# Patient Record
Sex: Female | Born: 1960 | Race: White | Hispanic: No | Marital: Married | Smoking: Former smoker
Health system: Southern US, Community
[De-identification: ages and names within clinical notes are randomized; demographics above are authoritative.]

## PROBLEM LIST (undated history)

## (undated) DIAGNOSIS — S83249A Other tear of medial meniscus, current injury, unspecified knee, initial encounter: Secondary | ICD-10-CM

## (undated) DIAGNOSIS — N3090 Cystitis, unspecified without hematuria: Secondary | ICD-10-CM

## (undated) DIAGNOSIS — K8020 Calculus of gallbladder without cholecystitis without obstruction: Secondary | ICD-10-CM

## (undated) DIAGNOSIS — E119 Type 2 diabetes mellitus without complications: Secondary | ICD-10-CM

## (undated) DIAGNOSIS — F419 Anxiety disorder, unspecified: Secondary | ICD-10-CM

## (undated) DIAGNOSIS — J309 Allergic rhinitis, unspecified: Secondary | ICD-10-CM

## (undated) DIAGNOSIS — S22000A Wedge compression fracture of unspecified thoracic vertebra, initial encounter for closed fracture: Secondary | ICD-10-CM

## (undated) DIAGNOSIS — G4700 Insomnia, unspecified: Secondary | ICD-10-CM

## (undated) DIAGNOSIS — R32 Unspecified urinary incontinence: Secondary | ICD-10-CM

## (undated) DIAGNOSIS — G5600 Carpal tunnel syndrome, unspecified upper limb: Secondary | ICD-10-CM

## (undated) DIAGNOSIS — G4730 Sleep apnea, unspecified: Secondary | ICD-10-CM

## (undated) DIAGNOSIS — Z720 Tobacco use: Secondary | ICD-10-CM

## (undated) DIAGNOSIS — M5134 Other intervertebral disc degeneration, thoracic region: Secondary | ICD-10-CM

## (undated) DIAGNOSIS — M7120 Synovial cyst of popliteal space [Baker], unspecified knee: Secondary | ICD-10-CM

## (undated) DIAGNOSIS — M1990 Unspecified osteoarthritis, unspecified site: Secondary | ICD-10-CM

## (undated) DIAGNOSIS — E039 Hypothyroidism, unspecified: Secondary | ICD-10-CM

## (undated) DIAGNOSIS — J449 Chronic obstructive pulmonary disease, unspecified: Secondary | ICD-10-CM

## (undated) DIAGNOSIS — M3130 Wegener's granulomatosis without renal involvement: Secondary | ICD-10-CM

## (undated) DIAGNOSIS — F329 Major depressive disorder, single episode, unspecified: Secondary | ICD-10-CM

## (undated) DIAGNOSIS — E8940 Asymptomatic postprocedural ovarian failure: Secondary | ICD-10-CM

## (undated) DIAGNOSIS — F32A Depression, unspecified: Secondary | ICD-10-CM

## (undated) DIAGNOSIS — E785 Hyperlipidemia, unspecified: Secondary | ICD-10-CM

## (undated) DIAGNOSIS — K5900 Constipation, unspecified: Secondary | ICD-10-CM

## (undated) HISTORY — DX: Chronic obstructive pulmonary disease, unspecified: J44.9

## (undated) HISTORY — PX: LUNG BIOPSY: SHX232

## (undated) HISTORY — DX: Tobacco use: Z72.0

## (undated) HISTORY — DX: Allergic rhinitis, unspecified: J30.9

## (undated) HISTORY — DX: Calculus of gallbladder without cholecystitis without obstruction: K80.20

## (undated) HISTORY — DX: Insomnia, unspecified: G47.00

## (undated) HISTORY — DX: Wegener's granulomatosis without renal involvement: M31.30

## (undated) HISTORY — PX: COLONOSCOPY: SHX174

## (undated) HISTORY — DX: Cystitis, unspecified without hematuria: N30.90

## (undated) HISTORY — DX: Carpal tunnel syndrome, unspecified upper limb: G56.00

## (undated) HISTORY — PX: ABDOMINAL HYSTERECTOMY: SHX81

## (undated) HISTORY — PX: KNEE SURGERY: SHX244

## (undated) HISTORY — PX: ESOPHAGOGASTRODUODENOSCOPY: SHX1529

## (undated) HISTORY — DX: Hypothyroidism, unspecified: E03.9

## (undated) HISTORY — PX: TONSILLECTOMY: SUR1361

## (undated) HISTORY — DX: Unspecified osteoarthritis, unspecified site: M19.90

## (undated) HISTORY — DX: Wedge compression fracture of unspecified thoracic vertebra, initial encounter for closed fracture: S22.000A

## (undated) HISTORY — PX: WRIST ARTHROSCOPY: SUR100

## (undated) HISTORY — DX: Type 2 diabetes mellitus without complications: E11.9

## (undated) HISTORY — DX: Other tear of medial meniscus, current injury, unspecified knee, initial encounter: S83.249A

## (undated) HISTORY — DX: Sleep apnea, unspecified: G47.30

## (undated) HISTORY — DX: Asymptomatic postprocedural ovarian failure: E89.40

## (undated) HISTORY — DX: Hyperlipidemia, unspecified: E78.5

## (undated) HISTORY — DX: Other intervertebral disc degeneration, thoracic region: M51.34

## (undated) HISTORY — DX: Synovial cyst of popliteal space (Baker), unspecified knee: M71.20

## (undated) HISTORY — DX: Unspecified urinary incontinence: R32

## (undated) HISTORY — DX: Constipation, unspecified: K59.00

## (undated) HISTORY — DX: Depression, unspecified: F32.A

## (undated) HISTORY — PX: CHOLECYSTECTOMY: SHX55

## (undated) HISTORY — DX: Anxiety disorder, unspecified: F41.9

## (undated) HISTORY — PX: BLADDER REPAIR: SHX76

---
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
## 2004-06-21 DIAGNOSIS — M7120 Synovial cyst of popliteal space [Baker], unspecified knee: Secondary | ICD-10-CM | POA: Insufficient documentation

## 2004-06-21 DIAGNOSIS — IMO0002 Reserved for concepts with insufficient information to code with codable children: Secondary | ICD-10-CM | POA: Insufficient documentation

## 2004-06-25 DIAGNOSIS — G5601 Carpal tunnel syndrome, right upper limb: Secondary | ICD-10-CM | POA: Insufficient documentation

## 2005-08-22 DIAGNOSIS — J439 Emphysema, unspecified: Secondary | ICD-10-CM | POA: Insufficient documentation

## 2006-08-03 DIAGNOSIS — S22009A Unspecified fracture of unspecified thoracic vertebra, initial encounter for closed fracture: Secondary | ICD-10-CM | POA: Insufficient documentation

## 2011-11-08 DIAGNOSIS — M3130 Wegener's granulomatosis without renal involvement: Secondary | ICD-10-CM | POA: Insufficient documentation

## 2011-11-08 DIAGNOSIS — M25552 Pain in left hip: Secondary | ICD-10-CM | POA: Insufficient documentation

## 2011-11-18 DIAGNOSIS — R0681 Apnea, not elsewhere classified: Secondary | ICD-10-CM | POA: Insufficient documentation

## 2012-06-05 DIAGNOSIS — E785 Hyperlipidemia, unspecified: Secondary | ICD-10-CM | POA: Insufficient documentation

## 2012-06-05 DIAGNOSIS — M810 Age-related osteoporosis without current pathological fracture: Secondary | ICD-10-CM | POA: Insufficient documentation

## 2012-06-05 DIAGNOSIS — Z90710 Acquired absence of both cervix and uterus: Secondary | ICD-10-CM | POA: Insufficient documentation

## 2012-06-05 DIAGNOSIS — J8410 Pulmonary fibrosis, unspecified: Secondary | ICD-10-CM | POA: Insufficient documentation

## 2012-07-21 DIAGNOSIS — R609 Edema, unspecified: Secondary | ICD-10-CM | POA: Insufficient documentation

## 2012-09-18 DIAGNOSIS — Z87891 Personal history of nicotine dependence: Secondary | ICD-10-CM | POA: Insufficient documentation

## 2012-09-27 DIAGNOSIS — Z86010 Personal history of colonic polyps: Secondary | ICD-10-CM | POA: Insufficient documentation

## 2013-03-13 DIAGNOSIS — E039 Hypothyroidism, unspecified: Secondary | ICD-10-CM | POA: Insufficient documentation

## 2013-06-20 DIAGNOSIS — N390 Urinary tract infection, site not specified: Secondary | ICD-10-CM | POA: Insufficient documentation

## 2013-08-07 DIAGNOSIS — Z79899 Other long term (current) drug therapy: Secondary | ICD-10-CM | POA: Insufficient documentation

## 2013-08-20 DIAGNOSIS — R140 Abdominal distension (gaseous): Secondary | ICD-10-CM | POA: Insufficient documentation

## 2014-03-18 DIAGNOSIS — R140 Abdominal distension (gaseous): Secondary | ICD-10-CM | POA: Insufficient documentation

## 2014-11-12 DIAGNOSIS — M160 Bilateral primary osteoarthritis of hip: Secondary | ICD-10-CM | POA: Insufficient documentation

## 2017-04-04 DIAGNOSIS — S12501D Unspecified nondisplaced fracture of sixth cervical vertebra, subsequent encounter for fracture with routine healing: Secondary | ICD-10-CM | POA: Insufficient documentation

## 2017-07-11 DIAGNOSIS — R519 Headache, unspecified: Secondary | ICD-10-CM | POA: Insufficient documentation

## 2019-07-04 DIAGNOSIS — Z1211 Encounter for screening for malignant neoplasm of colon: Secondary | ICD-10-CM | POA: Insufficient documentation

## 2019-07-26 ENCOUNTER — Encounter: Payer: Self-pay | Admitting: Gastroenterology

## 2019-08-23 DIAGNOSIS — F329 Major depressive disorder, single episode, unspecified: Secondary | ICD-10-CM | POA: Insufficient documentation

## 2019-08-23 DIAGNOSIS — F32A Depression, unspecified: Secondary | ICD-10-CM | POA: Insufficient documentation

## 2019-08-23 DIAGNOSIS — F419 Anxiety disorder, unspecified: Secondary | ICD-10-CM | POA: Insufficient documentation

## 2019-08-29 ENCOUNTER — Other Ambulatory Visit: Payer: Self-pay

## 2019-08-29 ENCOUNTER — Encounter: Payer: Self-pay | Admitting: Gastroenterology

## 2019-08-29 ENCOUNTER — Telehealth: Payer: Self-pay | Admitting: Gastroenterology

## 2019-08-29 ENCOUNTER — Ambulatory Visit (INDEPENDENT_AMBULATORY_CARE_PROVIDER_SITE_OTHER): Payer: 59 | Admitting: Gastroenterology

## 2019-08-29 ENCOUNTER — Encounter (INDEPENDENT_AMBULATORY_CARE_PROVIDER_SITE_OTHER): Payer: Self-pay

## 2019-08-29 ENCOUNTER — Ambulatory Visit (HOSPITAL_COMMUNITY)
Admission: RE | Admit: 2019-08-29 | Discharge: 2019-08-29 | Disposition: A | Discharge status: Home / Self Care | Payer: 59 | Source: Ambulatory Visit | Attending: Gastroenterology | Admitting: Gastroenterology

## 2019-08-29 VITALS — BP 128/86 | HR 72 | Temp 98.0°F | Ht 60.75 in | Wt 144.0 lb

## 2019-08-29 DIAGNOSIS — R140 Abdominal distension (gaseous): Secondary | ICD-10-CM | POA: Diagnosis not present

## 2019-08-29 DIAGNOSIS — Z9981 Dependence on supplemental oxygen: Secondary | ICD-10-CM | POA: Diagnosis not present

## 2019-08-29 DIAGNOSIS — R1084 Generalized abdominal pain: Secondary | ICD-10-CM | POA: Insufficient documentation

## 2019-08-29 LAB — POCT I-STAT CREATININE: Creatinine, Ser: 0.6 mg/dL (ref 0.44–1.00)

## 2019-08-29 MED ORDER — IOHEXOL 300 MG/ML  SOLN
100.0000 mL | Freq: Once | INTRAMUSCULAR | Status: AC | PRN
  Administered 2019-08-29: 100 mL via INTRAVENOUS

## 2019-08-29 MED ORDER — SODIUM CHLORIDE (PF) 0.9 % IJ SOLN
INTRAMUSCULAR | Status: AC
  Filled 2019-08-29: qty 50

## 2019-08-29 NOTE — Progress Notes (Addendum)
Referring Provider: Allie Dimmer, MD Primary Care Physician:  No primary care provider on file.  Reason for Consultation: Abdominal bloating   IMPRESSION:  Abdominal bloating Chronic constipation TSH 10.02  Differential includes: IBS-Constipated, slow transit, dyssynergic defecation.   PLAN: EGD CT of the abd/pelvis with contrast CMP, CBC with differential, B12, folate Trial of IBGuard  Please see the "Patient Instructions" section for addition details about the plan.  HPI: Tracy Avila is a 58 y.o. female referred by Dr. Jenean Lindau for further evaluation of abdominal bloating.  The history is obtained through the patient and review of her electronic health record.  She has a history of anxiety, COPD, diabetes, hyperlipidemia, hypothyroidism, Wegener's granulomatosis.  She has had a prior cholecystectomy for cholelithiasis.  She has chronic constipation.  Now controlled on Amitiza a 24 mcg twice daily, lactulose 30 cc twice daily, stool softeners and still has difficulty with defecation. Now having 3 bowel movements daily. Without medications she would go one week between between bowel movements. No straining. Some mucous. No blood. Sense of complete evacuation.  She had to manually disimpact herself in the past.  Occasionally uses Epsom salts or magnesium citrate.  Dr. Vernon Prey, a gastroenterologist with the [facility] in clinic added a daily dose of MiraLAX and recommended that she use magnesium citrate 1 bottle once or twice a week.  Constant bloating and abdominal distension x 9-12 months. When she eats she feels very tight within 20 minutes of eating. Early satiety. Good appetite. Has gained 20 pounds.  Occasional feels like there is a marble in the RUQ. Malodorous diarrhea. No eructation. No change with defecation.   She reports an abdominal  in [place] last month that did not reveal the source of bloating. It showed hepatic steatosis. No sonographic abnormalities were she describes the  knot in the abdominal wall. No additional abdominal imaging.   Colonoscopy with  two tubular adenomas at [facility]. Colonoscopy one month ago. No prior upper endoscopy.    Normal colonoscopy with Dr. Vernon Prey 07/04/2019. He recommended another colonoscopy in 5 years.   History of total abdominal hysterectomy. She was evaluated by GYN who told her they could not find the source.   Labs 05/20/2019: WBC 7.9, hemoglobin 12.7, platelets 241, MCV 102, BUN 29, creatinine 1.02, AST 22, ALT 32, alk phos 77, total bilirubin 0.3, TSH 10.01.  Her levothyroxine was increased to 88 mcg daily at that time.   No known family history of colon cancer or polyps. No family history of uterine/endometrial cancer, pancreatic cancer or gastric/stomach cancer.     Past Medical History:  Diagnosis Date  . Acute medial meniscus tear    left  . Allergic rhinitis   . Anxiety   . Baker's cyst    left knee  . Carpal tunnel syndrome   . Compression fracture of thoracic vertebra (HCC)    #7 and C 6  . Constipation   . COPD (chronic obstructive pulmonary disease) ([place])   . Cystitis   . DDD (degenerative disc disease), thoracic   . Depression   . Diabetes mellitus ([place])   . Hyperlipidemia   . Hypothyroidism   . Incontinence   . Insomnia   . Surgical menopause   . Tobacco abuse   . Wegener's granulomatosis ([place])    on remission 01/01/2019     Current Outpatient Medications  Medication Sig Dispense Refill  . ALPRAZolam (XANAX) 1 MG tablet Take 1 mg by mouth 3 (three) times daily as needed for anxiety.    Marland Kitchen  benzonatate (TESSALON) 100 MG capsule Take 100 mg by mouth 3 (three) times daily as needed.    . chlorhexidine (PERIDEX) 0.12 % solution Take 15 mLs by mouth 2 (two) times daily after a meal.    . citalopram (CELEXA) 20 MG tablet Take 20 mg by mouth daily.    Marland Kitchen esomeprazole (NEXIUM) 40 MG capsule TAKE 1 CAPSULE BY MOUTH 2 TIMES A DAY    . gemfibrozil (LOPID) 600 MG tablet Take 1 tablet by mouth 2 (two) times  daily before a meal.    . lactulose (CHRONULAC) 10 GM/15ML solution Take 30 mLs by mouth 2 (two) times daily.    Marland Kitchen levothyroxine (SYNTHROID) 88 MCG tablet Take 88 mcg by mouth daily.    Marland Kitchen lubiprostone (AMITIZA) 24 MCG capsule Take 24 mcg by mouth 2 (two) times daily.    . methadone (DOLOPHINE) 10 MG tablet Take by mouth.    . naloxone (NARCAN) 4 MG/0.1ML LIQD nasal spray kit Place 4 mg into the nose.    . oxyCODONE (OXY IR/ROXICODONE) 5 MG immediate release tablet Take by mouth.    . OXYGEN 2 L.    . trazodone (DESYREL) 300 MG tablet Take 300 mg by mouth at bedtime.     No current facility-administered medications for this visit.     Allergies as of 08/29/2019 - never reviewed  Allergen Reaction Noted  . Albuterol Other (See Comments) 11/18/2009  . Codeine Hives and Other (See Comments) 11/18/2009  . Levothyroxine Other (See Comments) and Palpitations 05/16/2013  . Morphine Hives and Rash 06/05/2012  . Ciprofloxacin Hives, Other (See Comments), and Rash 11/18/2009  . Zolmitriptan Nausea And Vomiting 11/18/2009  . Tape Rash 01/02/2019    Family History  Problem Relation Age of Onset  . Cancer Mother   . Emphysema Father   . Heart attack Father   . Diabetes Sister   . Heart attack Brother     Social History   Socioeconomic History  . Marital status: Not on file    Spouse name: Not on file  . Number of children: Not on file  . Years of education: Not on file  . Highest education level: Not on file  Occupational History  . Not on file  Social Needs  . Financial resource strain: Not on file  . Food insecurity    Worry: Not on file    Inability: Not on file  . Transportation needs    Medical: Not on file    Non-medical: Not on file  Tobacco Use  . Smoking status: Former Smoker    Types: Cigarettes    Quit date: 07/27/2012    Years since quitting: 7.0  Substance and Sexual Activity  . Alcohol use: Not on file  . Drug use: Not on file  . Sexual activity: Not on file   Lifestyle  . Physical activity    Days per week: Not on file    Minutes per session: Not on file  . Stress: Not on file  Relationships  . Social Herbalist on phone: Not on file    Gets together: Not on file    Attends religious service: Not on file    Active member of club or organization: Not on file    Attends meetings of clubs or organizations: Not on file    Relationship status: Not on file  . Intimate partner violence    Fear of current or ex partner: Not on file  Emotionally abused: Not on file    Physically abused: Not on file    Forced sexual activity: Not on file  Other Topics Concern  . Not on file  Social History Narrative  . Not on file    Review of Systems: 12 system ROS is negative except as noted above except for anxiety, back pain, depression, headaches, itching, muscle pains, insomnia.   Physical Exam: General:   Alert, pleasant and cooperative in NAD Head:  Normocephalic and atraumatic. Eyes:  Sclera clear, no icterus.   Conjunctiva pink. Ears:  Normal auditory acuity. Nose:  No deformity, discharge,  or lesions. Mouth:  No deformity or lesions.   Neck:  Supple; no masses or thyromegaly. Lungs:  Clear throughout to auscultation.   No wheezes. Heart:  Regular rate and rhythm; no murmurs. Abdomen: Protuberent, firm, nontender,normal bowel sounds, no rebound or guarding. No hepatosplenomegaly.  No fluid wave.  Rectal:  Deferred  Msk:  Symmetrical. No boney deformities LAD: No inguinal or umbilical LAD Extremities:  No clubbing or edema. Neurologic:  Alert and  oriented x4;  grossly nonfocal Skin:  Intact without significant lesions or rashes. Psych:  Alert and cooperative. Normal mood and affect.   Emric Kowalewski L. Tarri Glenn, MD, MPH 08/29/2019, 12:36 PM

## 2019-08-29 NOTE — H&P (View-Only) (Signed)
Referring Provider: Allie Dimmer, MD Primary Care Physician:  No primary care provider on file.  Reason for Consultation: Abdominal bloating   IMPRESSION:  Abdominal bloating Chronic constipation TSH 10.02  Differential includes: IBS-Constipated, slow transit, dyssynergic defecation.   PLAN: EGD CT of the abd/pelvis with contrast CMP, CBC with differential, B12, folate Trial of IBGuard  Please see the "Patient Instructions" section for addition details about the plan.  HPI: Tracy Avila is a 58 y.o. female referred by Dr. Jenean Lindau for further evaluation of abdominal bloating.  The history is obtained through the patient and review of her electronic health record.  She has a history of anxiety, COPD, diabetes, hyperlipidemia, hypothyroidism, Wegener's granulomatosis.  She has had a prior cholecystectomy for cholelithiasis.  She has chronic constipation.  Now controlled on Amitiza a 24 mcg twice daily, lactulose 30 cc twice daily, stool softeners and still has difficulty with defecation. Now having 3 bowel movements daily. Without medications she would go one week between between bowel movements. No straining. Some mucous. No blood. Sense of complete evacuation.  She had to manually disimpact herself in the past.  Occasionally uses Epsom salts or magnesium citrate.  Dr. Vernon Prey, a gastroenterologist with the [facility] in clinic added a daily dose of MiraLAX and recommended that she use magnesium citrate 1 bottle once or twice a week.  Constant bloating and abdominal distension x 9-12 months. When she eats she feels very tight within 20 minutes of eating. Early satiety. Good appetite. Has gained 20 pounds.  Occasional feels like there is a marble in the RUQ. Malodorous diarrhea. No eructation. No change with defecation.   She reports an abdominal  in [place] last month that did not reveal the source of bloating. It showed hepatic steatosis. No sonographic abnormalities were she describes the  knot in the abdominal wall. No additional abdominal imaging.   Colonoscopy with  two tubular adenomas at [facility]. Colonoscopy one month ago. No prior upper endoscopy.    Normal colonoscopy with Dr. Vernon Prey 07/04/2019. He recommended another colonoscopy in 5 years.   History of total abdominal hysterectomy. She was evaluated by GYN who told her they could not find the source.   Labs 05/20/2019: WBC 7.9, hemoglobin 12.7, platelets 241, MCV 102, BUN 29, creatinine 1.02, AST 22, ALT 32, alk phos 77, total bilirubin 0.3, TSH 10.01.  Her levothyroxine was increased to 88 mcg daily at that time.   No known family history of colon cancer or polyps. No family history of uterine/endometrial cancer, pancreatic cancer or gastric/stomach cancer.     Past Medical History:  Diagnosis Date   Acute medial meniscus tear    left   Allergic rhinitis    Anxiety    Baker's cyst    left knee   Carpal tunnel syndrome    Compression fracture of thoracic vertebra (HCC)    #7 and C 6   Constipation    COPD (chronic obstructive pulmonary disease) (HCC)    Cystitis    DDD (degenerative disc disease), thoracic    Depression    Diabetes mellitus ([place])    Hyperlipidemia    Hypothyroidism    Incontinence    Insomnia    Surgical menopause    Tobacco abuse    Wegener's granulomatosis ([place])    on remission 01/01/2019     Current Outpatient Medications  Medication Sig Dispense Refill   ALPRAZolam (XANAX) 1 MG tablet Take 1 mg by mouth 3 (three) times daily as needed for anxiety.  benzonatate (TESSALON) 100 MG capsule Take 100 mg by mouth 3 (three) times daily as needed.     chlorhexidine (PERIDEX) 0.12 % solution Take 15 mLs by mouth 2 (two) times daily after a meal.     citalopram (CELEXA) 20 MG tablet Take 20 mg by mouth daily.     esomeprazole (NEXIUM) 40 MG capsule TAKE 1 CAPSULE BY MOUTH 2 TIMES A DAY     gemfibrozil (LOPID) 600 MG tablet Take 1 tablet by mouth 2 (two) times  daily before a meal.     lactulose (CHRONULAC) 10 GM/15ML solution Take 30 mLs by mouth 2 (two) times daily.     levothyroxine (SYNTHROID) 88 MCG tablet Take 88 mcg by mouth daily.     lubiprostone (AMITIZA) 24 MCG capsule Take 24 mcg by mouth 2 (two) times daily.     methadone (DOLOPHINE) 10 MG tablet Take by mouth.     naloxone (NARCAN) 4 MG/0.1ML LIQD nasal spray kit Place 4 mg into the nose.     oxyCODONE (OXY IR/ROXICODONE) 5 MG immediate release tablet Take by mouth.     OXYGEN 2 L.     trazodone (DESYREL) 300 MG tablet Take 300 mg by mouth at bedtime.     No current facility-administered medications for this visit.     Allergies as of 08/29/2019 - never reviewed  Allergen Reaction Noted   Albuterol Other (See Comments) 11/18/2009   Codeine Hives and Other (See Comments) 11/18/2009   Levothyroxine Other (See Comments) and Palpitations 05/16/2013   Morphine Hives and Rash 06/05/2012   Ciprofloxacin Hives, Other (See Comments), and Rash 11/18/2009   Zolmitriptan Nausea And Vomiting 11/18/2009   Tape Rash 01/02/2019    Family History  Problem Relation Age of Onset   Cancer Mother    Emphysema Father    Heart attack Father    Diabetes Sister    Heart attack Brother     Social History   Socioeconomic History   Marital status: Not on file    Spouse name: Not on file   Number of children: Not on file   Years of education: Not on file   Highest education level: Not on file  Occupational History   Not on file  Social Needs   Financial resource strain: Not on file   Food insecurity    Worry: Not on file    Inability: Not on file   Transportation needs    Medical: Not on file    Non-medical: Not on file  Tobacco Use   Smoking status: Former Smoker    Types: Cigarettes    Quit date: 07/27/2012    Years since quitting: 7.0  Substance and Sexual Activity   Alcohol use: Not on file   Drug use: Not on file   Sexual activity: Not on file    Lifestyle   Physical activity    Days per week: Not on file    Minutes per session: Not on file   Stress: Not on file  Relationships   Social connections    Talks on phone: Not on file    Gets together: Not on file    Attends religious service: Not on file    Active member of club or organization: Not on file    Attends meetings of clubs or organizations: Not on file    Relationship status: Not on file   Intimate partner violence    Fear of current or ex partner: Not on file  Emotionally abused: Not on file    Physically abused: Not on file    Forced sexual activity: Not on file  Other Topics Concern   Not on file  Social History Narrative   Not on file    Review of Systems: 12 system ROS is negative except as noted above except for anxiety, back pain, depression, headaches, itching, muscle pains, insomnia.   Physical Exam: General:   Alert, pleasant and cooperative in NAD Head:  Normocephalic and atraumatic. Eyes:  Sclera clear, no icterus.   Conjunctiva pink. Ears:  Normal auditory acuity. Nose:  No deformity, discharge,  or lesions. Mouth:  No deformity or lesions.   Neck:  Supple; no masses or thyromegaly. Lungs:  Clear throughout to auscultation.   No wheezes. Heart:  Regular rate and rhythm; no murmurs. Abdomen: Protuberent, firm, nontender,normal bowel sounds, no rebound or guarding. No hepatosplenomegaly.  No fluid wave.  Rectal:  Deferred  Msk:  Symmetrical. No boney deformities LAD: No inguinal or umbilical LAD Extremities:  No clubbing or edema. Neurologic:  Alert and  oriented x4;  grossly nonfocal Skin:  Intact without significant lesions or rashes. Psych:  Alert and cooperative. Normal mood and affect.   Jamara Vary L. Tarri Glenn, MD, MPH 08/29/2019, 12:36 PM

## 2019-08-30 NOTE — Telephone Encounter (Signed)
See result note.  

## 2019-08-30 NOTE — Telephone Encounter (Signed)
Pt said she is returning your call °

## 2019-09-04 ENCOUNTER — Encounter: Payer: 59 | Admitting: Gastroenterology

## 2019-09-05 ENCOUNTER — Telehealth: Payer: Self-pay | Admitting: Gastroenterology

## 2019-09-05 DIAGNOSIS — R140 Abdominal distension (gaseous): Secondary | ICD-10-CM

## 2019-09-05 DIAGNOSIS — R1084 Generalized abdominal pain: Secondary | ICD-10-CM

## 2019-09-05 DIAGNOSIS — Z9981 Dependence on supplemental oxygen: Secondary | ICD-10-CM

## 2019-09-05 NOTE — Telephone Encounter (Signed)
You are [facility] 10/5 and 10/28 and your hospital week is starting 11/2.

## 2019-09-05 NOTE — Telephone Encounter (Signed)
Do we have any options on a purple block for one EGD?

## 2019-09-05 NOTE — Telephone Encounter (Signed)
I would be happy to try to do it on any of those days. Thanks.

## 2019-09-05 NOTE — Telephone Encounter (Signed)
Dr. Tarri Glenn your next available hospital schedule is on 10-28-2019.ok to wait until then?

## 2019-09-06 NOTE — Telephone Encounter (Signed)
Spoke to patient and signed her up for mychart and she will be able to access these instructions. Scheduled for 09-16-2019.

## 2019-09-06 NOTE — Telephone Encounter (Signed)
Left message on patients voicemail to call office.   

## 2019-09-06 NOTE — Telephone Encounter (Signed)
#  [phone]   Pt said she is returning your call

## 2019-09-09 NOTE — Addendum Note (Signed)
Addended by: Wyline Beady on: 09/09/2019 10:34 AM   Modules accepted: Orders

## 2019-09-09 NOTE — Addendum Note (Signed)
Addended by: Wyline Beady on: 09/09/2019 10:08 AM   Modules accepted: Orders, SmartSet

## 2019-09-09 NOTE — Telephone Encounter (Signed)
Instructions and [phone] screening instructions sent to patient via mychart.

## 2019-09-10 NOTE — Progress Notes (Signed)
Unable to reach Tracy Avila for preop endoscopy call. Left message for her to return call.

## 2019-09-12 ENCOUNTER — Other Ambulatory Visit (HOSPITAL_COMMUNITY)
Admission: RE | Admit: 2019-09-12 | Discharge: 2019-09-12 | Disposition: A | Discharge status: Home / Self Care | Payer: 59 | Source: Home / Self Care | Attending: Gastroenterology | Admitting: Gastroenterology

## 2019-09-12 ENCOUNTER — Other Ambulatory Visit (HOSPITAL_COMMUNITY)
Admission: RE | Admit: 2019-09-12 | Discharge: 2019-09-12 | Disposition: A | Discharge status: Home / Self Care | Payer: 59 | Attending: Gastroenterology | Admitting: Gastroenterology

## 2019-09-12 DIAGNOSIS — Z20828 Contact with and (suspected) exposure to other viral communicable diseases: Secondary | ICD-10-CM | POA: Diagnosis not present

## 2019-09-12 DIAGNOSIS — Z01812 Encounter for preprocedural laboratory examination: Secondary | ICD-10-CM | POA: Diagnosis not present

## 2019-09-13 LAB — NOVEL CORONAVIRUS, NAA (HOSP ORDER, SEND-OUT TO REF LAB; TAT 18-24 HRS): SARS-CoV-2, NAA: NOT DETECTED

## 2019-09-15 NOTE — Anesthesia Preprocedure Evaluation (Addendum)
Anesthesia Evaluation  Patient identified by MRN, date of birth, ID band Patient awake    Reviewed: Allergy & Precautions, NPO status , Patient's Chart, lab work & pertinent test results  Airway Mallampati: II  TM Distance: >3 FB Neck ROM: Full    Dental no notable dental hx. (+) Upper Dentures, Lower Dentures   Pulmonary sleep apnea , former smoker,    Pulmonary exam normal breath sounds clear to auscultation       Cardiovascular Exercise Tolerance: Good negative cardio ROS Normal cardiovascular exam Rhythm:Regular Rate:Normal     Neuro/Psych Depression  Neuromuscular disease negative psych ROS   GI/Hepatic negative GI ROS, Neg liver ROS,   Endo/Other  diabetes, Type 1Hypothyroidism   Renal/GU negative Renal ROS     Musculoskeletal  (+) Arthritis ,   Abdominal   Peds  Hematology negative hematology ROS (+)   Anesthesia Other Findings   Reproductive/Obstetrics negative OB ROS                            Anesthesia Physical Anesthesia Plan  ASA: III  Anesthesia Plan: MAC   Post-op Pain Management:    Induction: Intravenous  PONV Risk Score and Plan: Treatment may vary due to age or medical condition, Ondansetron and Dexamethasone  Airway Management Planned: Natural Airway and Nasal Cannula  Additional Equipment: None  Intra-op Plan:   Post-operative Plan:   Informed Consent: I have reviewed the patients History and Physical, chart, labs and discussed the procedure including the risks, benefits and alternatives for the proposed anesthesia with the patient or authorized representative who has indicated his/her understanding and acceptance.     Dental advisory given  Plan Discussed with: CRNA  Anesthesia Plan Comments: (EGD for constipation bloating and abdominal pain)       Anesthesia Quick Evaluation

## 2019-09-16 ENCOUNTER — Ambulatory Visit (HOSPITAL_COMMUNITY): Payer: 59 | Admitting: Anesthesiology

## 2019-09-16 ENCOUNTER — Encounter (HOSPITAL_COMMUNITY)
Admission: RE | Disposition: A | Discharge status: Home / Self Care | Payer: Self-pay | Source: Home / Self Care | Attending: Gastroenterology

## 2019-09-16 ENCOUNTER — Ambulatory Visit (HOSPITAL_COMMUNITY)
Admission: RE | Admit: 2019-09-16 | Discharge: 2019-09-16 | Disposition: A | Discharge status: Home / Self Care | Payer: 59 | Attending: Gastroenterology | Admitting: Gastroenterology

## 2019-09-16 ENCOUNTER — Encounter (HOSPITAL_COMMUNITY): Payer: Self-pay | Admitting: *Deleted

## 2019-09-16 DIAGNOSIS — K5909 Other constipation: Secondary | ICD-10-CM | POA: Diagnosis not present

## 2019-09-16 DIAGNOSIS — Z87891 Personal history of nicotine dependence: Secondary | ICD-10-CM | POA: Diagnosis not present

## 2019-09-16 DIAGNOSIS — E785 Hyperlipidemia, unspecified: Secondary | ICD-10-CM | POA: Diagnosis not present

## 2019-09-16 DIAGNOSIS — F329 Major depressive disorder, single episode, unspecified: Secondary | ICD-10-CM | POA: Insufficient documentation

## 2019-09-16 DIAGNOSIS — K2950 Unspecified chronic gastritis without bleeding: Secondary | ICD-10-CM | POA: Insufficient documentation

## 2019-09-16 DIAGNOSIS — K2970 Gastritis, unspecified, without bleeding: Secondary | ICD-10-CM

## 2019-09-16 DIAGNOSIS — E039 Hypothyroidism, unspecified: Secondary | ICD-10-CM | POA: Insufficient documentation

## 2019-09-16 DIAGNOSIS — J449 Chronic obstructive pulmonary disease, unspecified: Secondary | ICD-10-CM | POA: Diagnosis not present

## 2019-09-16 DIAGNOSIS — Z79890 Hormone replacement therapy (postmenopausal): Secondary | ICD-10-CM | POA: Diagnosis not present

## 2019-09-16 DIAGNOSIS — Z79899 Other long term (current) drug therapy: Secondary | ICD-10-CM | POA: Insufficient documentation

## 2019-09-16 DIAGNOSIS — K3189 Other diseases of stomach and duodenum: Secondary | ICD-10-CM | POA: Diagnosis not present

## 2019-09-16 DIAGNOSIS — Z9981 Dependence on supplemental oxygen: Secondary | ICD-10-CM | POA: Diagnosis not present

## 2019-09-16 DIAGNOSIS — R140 Abdominal distension (gaseous): Secondary | ICD-10-CM

## 2019-09-16 DIAGNOSIS — G4700 Insomnia, unspecified: Secondary | ICD-10-CM | POA: Diagnosis not present

## 2019-09-16 DIAGNOSIS — R1084 Generalized abdominal pain: Secondary | ICD-10-CM

## 2019-09-16 DIAGNOSIS — E119 Type 2 diabetes mellitus without complications: Secondary | ICD-10-CM | POA: Diagnosis not present

## 2019-09-16 DIAGNOSIS — F419 Anxiety disorder, unspecified: Secondary | ICD-10-CM | POA: Diagnosis not present

## 2019-09-16 HISTORY — PX: BIOPSY: SHX5522

## 2019-09-16 HISTORY — PX: ESOPHAGOGASTRODUODENOSCOPY (EGD) WITH PROPOFOL: SHX5813

## 2019-09-16 SURGERY — ESOPHAGOGASTRODUODENOSCOPY (EGD) WITH PROPOFOL
Anesthesia: Monitor Anesthesia Care

## 2019-09-16 MED ORDER — PROPOFOL 500 MG/50ML IV EMUL
INTRAVENOUS | Status: DC | PRN
  Administered 2019-09-16: 125 ug/kg/min via INTRAVENOUS

## 2019-09-16 MED ORDER — LACTATED RINGERS IV SOLN
INTRAVENOUS | Status: DC
  Administered 2019-09-16: 1000 mL via INTRAVENOUS

## 2019-09-16 MED ORDER — SODIUM CHLORIDE 0.9 % IV SOLN: INTRAVENOUS | Status: DC

## 2019-09-16 MED ORDER — PROPOFOL 10 MG/ML IV BOLUS
INTRAVENOUS | Status: AC
  Filled 2019-09-16: qty 60

## 2019-09-16 MED ORDER — PROPOFOL 10 MG/ML IV BOLUS
INTRAVENOUS | Status: DC | PRN
  Administered 2019-09-16 (×2): 20 mg via INTRAVENOUS

## 2019-09-16 SURGICAL SUPPLY — 15 items

## 2019-09-16 NOTE — Anesthesia Postprocedure Evaluation (Signed)
Anesthesia Post Note  Patient: Tracy Avila  Procedure(s) Performed: ESOPHAGOGASTRODUODENOSCOPY (EGD) WITH PROPOFOL (N/A ) BIOPSY     Patient location during evaluation: Endoscopy Anesthesia Type: MAC Level of consciousness: awake and alert Pain management: pain level controlled Vital Signs Assessment: post-procedure vital signs reviewed and stable Respiratory status: spontaneous breathing, nonlabored ventilation, respiratory function stable and patient connected to nasal cannula oxygen Cardiovascular status: blood pressure returned to baseline and stable Postop Assessment: no apparent nausea or vomiting Anesthetic complications: no    Last Vitals:  Vitals:   09/16/19 0910 09/16/19 0915  BP: (!) 108/58 (!) 103/59  Pulse: 67 72  Resp: 13 15  Temp:    SpO2: 98% 95%    Last Pain:  Vitals:   09/16/19 0915  TempSrc:   PainSc: 0-No pain                 Barnet Glasgow

## 2019-09-16 NOTE — Interval H&P Note (Signed)
History and Physical Interval Note:  09/16/2019 8:30 AM  Tracy Avila  has presented today for surgery, with the diagnosis of abdominal bloating and pain.  The various methods of treatment have been discussed with the patient and family. After consideration of risks, benefits and other options for treatment, the patient has consented to  Procedure(s): ESOPHAGOGASTRODUODENOSCOPY (EGD) WITH PROPOFOL (N/A) as a surgical intervention.  The patient's history has been reviewed, patient examined, no change in status, stable for surgery.  I have reviewed the patient's chart and labs.  Questions were answered to the patient's satisfaction.     [place]

## 2019-09-16 NOTE — Transfer of Care (Signed)
Immediate Anesthesia Transfer of Care Note  Patient: Alenah Sarria  Procedure(s) Performed: ESOPHAGOGASTRODUODENOSCOPY (EGD) WITH PROPOFOL (N/A ) BIOPSY  Patient Location: PACU  Anesthesia Type:MAC  Level of Consciousness: sedated  Airway & Oxygen Therapy: Patient Spontanous Breathing and Patient connected to nasal cannula oxygen  Post-op Assessment: Report given to RN and Post -op Vital signs reviewed and stable  Post vital signs: Reviewed and stable  Last Vitals:  Vitals Value Taken Time  BP    Temp    Pulse 67 09/16/19 0856  Resp 16 09/16/19 0856  SpO2 98 % 09/16/19 0856  Vitals shown include unvalidated device data.  Last Pain:  Vitals:   09/16/19 0728  TempSrc: Oral  PainSc: 0-No pain         Complications: No apparent anesthesia complications

## 2019-09-16 NOTE — Op Note (Signed)
[facility] Patient Name: Tracy Avila Procedure Date: 09/16/2019 MRN: [id number] Attending MD: Tressia Danas MD, MD Date of Birth: 1961-10-07 CSN: [id number] Age: 58 Admit Type: Outpatient Procedure:                Upper GI endoscopy Indications:              Abdominal bloating not responding to a trial of                            [facility] Providers:                Tressia Danas MD, MD, Dwain Sarna, RN,                            Kandice Robinsons, Technician Referring MD:              Medicines:                See the Anesthesia note for documentation of the                            administered medications Complications:            No immediate complications. Estimated blood loss:                            Minimal. Estimated Blood Loss:     Estimated blood loss was minimal. Procedure:                Pre-Anesthesia Assessment:                           - Prior to the procedure, a History and Physical                            was performed, and patient medications and                            allergies were reviewed. The patient's tolerance of                            previous anesthesia was also reviewed. The risks                            and benefits of the procedure and the sedation                            options and risks were discussed with the patient.                            All questions were answered, and informed consent                            was obtained. Prior Anticoagulants: The patient has                            taken no previous  anticoagulant or antiplatelet                            agents. ASA Grade Assessment: III - A patient with                            severe systemic disease. After reviewing the risks                            and benefits, the patient was deemed in                            satisfactory condition to undergo the procedure.                           After obtaining informed consent, the  endoscope was                            passed under direct vision. Throughout the                            procedure, the patient's blood pressure, pulse, and                            oxygen saturations were monitored continuously. The                            GIF-H190 ([id number]) Olympus gastroscope was                            introduced through the mouth, and advanced to the                            third part of duodenum. The upper GI endoscopy was                            accomplished without difficulty. The patient                            tolerated the procedure well. Scope In: Scope Out: Findings:      The esophagus was normal.      Patchy mild inflammation characterized by erythema, friability and       granularity was found in the gastric body. There was some heme overlying       areas of gastritis. Biopsies were taken from the antrum, body, and       fundus with a cold forceps for histology. Estimated blood loss was       minimal.      Diffuse mildly erythematous mucosa without active bleeding and with no       stigmata of bleeding was found in the duodenal bulb. Biopsies were taken       with a cold forceps for histology. Estimated blood loss was minimal.      The cardia and gastric fundus were normal on retroflexion.      The exam was otherwise without  abnormality. Impression:               - Normal esophagus.                           - Gastritis. Biopsied.                           - Erythematous duodenopathy. Biopsied.                           - The examination was otherwise normal. Moderate Sedation:      Not Applicable - Patient had care per Anesthesia. Recommendation:           - Patient has a contact number available for                            emergencies. The signs and symptoms of potential                            delayed complications were discussed with the                            patient. Return to normal activities tomorrow.                             Written discharge instructions were provided to the                            patient.                           - Resume previous diet today.                           - Continue present medications including Nexium 40                            mg twice daily.                           - No aspirin, ibuprofen, naproxen, or other                            non-steroidal anti-inflammatory drugs.                           - Await pathology results. Procedure Code(s):        --- Professional ---                           [phone], Esophagogastroduodenoscopy, flexible,                            transoral; with biopsy, single or multiple Diagnosis Code(s):        --- Professional ---  K29.70, Gastritis, unspecified, without bleeding                           K31.89, Other diseases of stomach and duodenum                           R14.0, Abdominal distension (gaseous) CPT copyright 2019 American Medical Association. All rights reserved. The codes documented in this report are preliminary and upon coder review may  be revised to meet current compliance requirements. Tressia DanasKimberly Maisen Klingler MD, MD 09/16/2019 9:00:14 AM This report has been signed electronically. Number of Addenda: 0

## 2019-09-16 NOTE — Discharge Instructions (Signed)
YOU HAD AN ENDOSCOPIC PROCEDURE TODAY: Refer to the procedure report and other information in the discharge instructions given to you for any specific questions about what was found during the examination. If this information does not answer your questions, please call [place] office at [phone] to clarify.  ° °YOU SHOULD EXPECT: Some feelings of bloating in the abdomen. Passage of more gas than usual. Walking can help get rid of the air that was put into your GI tract during the procedure and reduce the bloating. If you had a lower endoscopy (such as a colonoscopy or flexible sigmoidoscopy) you may notice spotting of blood in your stool or on the toilet paper. Some abdominal soreness may be present for a day or two, also. ° °DIET: Your first meal following the procedure should be a light meal and then it is ok to progress to your normal diet. A half-sandwich or bowl of soup is an example of a good first meal. Heavy or fried foods are harder to digest and may make you feel nauseous or bloated. Drink plenty of fluids but you should avoid alcoholic beverages for 24 hours. If you had a esophageal dilation, please see attached instructions for diet.   ° °ACTIVITY: Your care partner should take you home directly after the procedure. You should plan to take it easy, moving slowly for the rest of the day. You can resume normal activity the day after the procedure however YOU SHOULD NOT DRIVE, use power tools, machinery or perform tasks that involve climbing or major physical exertion for 24 hours (because of the sedation medicines used during the test).  ° °SYMPTOMS TO REPORT IMMEDIATELY: °A gastroenterologist can be reached at any hour. Please call [phone]  for any of the following symptoms:  °Following lower endoscopy (colonoscopy, flexible sigmoidoscopy) °Excessive amounts of blood in the stool  °Significant tenderness, worsening of abdominal pains  °Swelling of the abdomen that is new, acute  °Fever of 100° or  higher  °Following upper endoscopy (EGD, EUS, ERCP, esophageal dilation) °Vomiting of blood or coffee ground material  °New, significant abdominal pain  °New, significant chest pain or pain under the shoulder blades  °Painful or persistently difficult swallowing  °New shortness of breath  °Black, tarry-looking or red, bloody stools ° °FOLLOW UP:  °If any biopsies were taken you will be contacted by phone or by letter within the next 1-3 weeks. Call [phone]  if you have not heard about the biopsies in 3 weeks.  °Please also call with any specific questions about appointments or follow up tests. ° °

## 2019-09-17 ENCOUNTER — Other Ambulatory Visit: Payer: Self-pay | Admitting: *Deleted

## 2019-09-17 ENCOUNTER — Encounter (HOSPITAL_COMMUNITY): Payer: Self-pay | Admitting: Gastroenterology

## 2019-09-17 DIAGNOSIS — R140 Abdominal distension (gaseous): Secondary | ICD-10-CM

## 2019-09-17 LAB — SURGICAL PATHOLOGY

## 2019-09-20 ENCOUNTER — Encounter: Payer: 59 | Admitting: Gastroenterology

## 2019-09-26 ENCOUNTER — Other Ambulatory Visit: Payer: Self-pay

## 2019-09-26 ENCOUNTER — Ambulatory Visit (HOSPITAL_COMMUNITY)
Admission: RE | Admit: 2019-09-26 | Discharge: 2019-09-26 | Disposition: A | Discharge status: Home / Self Care | Payer: 59 | Source: Ambulatory Visit | Attending: Gastroenterology | Admitting: Gastroenterology

## 2019-09-26 DIAGNOSIS — R140 Abdominal distension (gaseous): Secondary | ICD-10-CM | POA: Diagnosis present

## 2019-10-08 NOTE — Progress Notes (Signed)
Referring Provider: Allie Dimmer, MD Primary Care Physician:  No primary care provider on file.  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 Vernon Prey in 2020    -Surveillance recommended in 5 years  Initially I thought some of her bloating might be related to constipation.  Thyroid dysfunction may be contributing. IBS-Constipated, slow transit, dyssynergic defecation may be contributing but would not explain the progression in her symptoms.   Today we discussed dietary changes that may make gas and bloating worse.  She was asked to avoid carbonated beverages, caffeinated beverages, sugar substitutes, chewing gum and other gas-producing foods such as legumes, onions, cabbage, brussel sprouts, and artificial sweeteners.  She was instructed not gulp foods or liquids.  PLAN: Trial low fodmap diet x 4 weeks Discontinue Splenda and other sugar substitutes Trial of IBGuard Consider anorectal manometry Follow-up visit in 2 months, earlier if needed  Please see the "Patient Instructions" section for addition details about the plan.  HPI: Tracy Avila is a 58 y.o. female referred for bdominal bloating.  Initial consultation performed 08/29/2019.  Upper endoscopy performed 09/16/2019.  The interval history is obtained through the patient, her husband who accompanies her to this appointment, and review of her electronic health record.  She has a history of anxiety, COPD, diabetes, hyperlipidemia, hypothyroidism,  Wegener's granulomatosis.  She has had a prior cholecystectomy for cholelithiasis.  She has chronic constipation controlled on Amitiza a 24 mcg twice daily, lactulose 30 cc twice daily, stool softeners. Has a sense of incomplete evacuation without straining.  Now having 3 bowel movements daily. Without medications she would go one week between between bowel movements. She had to manually disimpact herself in the past.  Occasionally uses Epsom salts or magnesium citrate.  Dr. Vernon Prey, a gastroenterologist with the [facility] clinic added a daily dose of MiraLAX and recommended that she use magnesium citrate 1 bottle once or twice a week.  Evaluation since her initial consultation includes: CT of the abdomen and pelvis with contrast 08/29/2019: Hepatic steatosis, prior cholecystectomy, bilateral adrenal nodules, small kidney cysts EGD 09/16/2019 showed H. pylori negative chronic inactive gastritis gastritis.  Duodenal biopsies were normal. Upper GI series with small bowel follow-through 09/26/2019 showed mild fold thickening in the stomach that might be related to gastritis otherwise normal  Also reports constant bloating and abdominal distension x 12 months. When she eats she feels very tight within 20 minutes of eating. Early satiety. Good appetite. Has gained 20 pounds.  Occasional feels like there is a marble in the RUQ. Malodorous diarrhea. No eructation. No change with defecation.   Frustrated by what she feels is progressive bloating and distention.  She has gained two pound since her last visit.  No flatus or eructation. Tried a low carb diet 3-4 months several times.  She uses Splenda frequently to avoid sugar.  She is concerned that kidney infections are related.   She has tried no new therapy since her last visit.  No complementary or alternative therapies.   Labs 05/20/2019:  WBC 7.9, hemoglobin 12.7, platelets 241, MCV 102, BUN 29, creatinine 1.02, AST 22, ALT 32, alk phos 77, total bilirubin 0.3,  TSH 10.01.  Her levothyroxine was increased to 88 mcg daily at that time.    Past Medical History:  Diagnosis Date  . Acute medial meniscus tear    left  . Allergic rhinitis   . Anxiety   . Baker's cyst    left knee  . Carpal tunnel syndrome   . Compression fracture of thoracic vertebra (HCC)    #7 and C 6  . Constipation   . COPD (chronic obstructive pulmonary disease) ([place])   . Cystitis   . DDD (degenerative disc disease), thoracic   . Depression   . Diabetes mellitus ([place])   . Hyperlipidemia   . Hypothyroidism   . Incontinence   . Insomnia   . Surgical menopause   . Tobacco abuse   . Wegener's granulomatosis ([place])    on remission 01/01/2019     Current Outpatient Medications  Medication Sig Dispense Refill  . ALPRAZolam (XANAX) 1 MG tablet Take 1 mg by mouth 3 (three) times daily as needed for anxiety.    . benzonatate (TESSALON) 100 MG capsule Take 100 mg by mouth 3 (three) times daily as needed.    . chlorhexidine (PERIDEX) 0.12 % solution Take 15 mLs by mouth 2 (two) times daily after a meal.    . citalopram (CELEXA) 20 MG tablet Take 20 mg by mouth daily.    Marland Kitchen esomeprazole (NEXIUM) 40 MG capsule TAKE 1 CAPSULE BY MOUTH 2 TIMES A DAY    . gemfibrozil (LOPID) 600 MG tablet Take 1 tablet by mouth 2 (two) times daily before a meal.    . lactulose (CHRONULAC) 10 GM/15ML solution Take 30 mLs by mouth 2 (two) times daily.    Marland Kitchen levothyroxine (SYNTHROID) 88 MCG tablet Take 88 mcg by mouth daily.    Marland Kitchen lubiprostone (AMITIZA) 24 MCG capsule Take 24 mcg by mouth 2 (two) times daily.    . methadone (DOLOPHINE) 10 MG tablet Take by mouth.    . naloxone (NARCAN) 4 MG/0.1ML LIQD nasal spray kit Place 4 mg into the nose.    . oxyCODONE (OXY IR/ROXICODONE) 5 MG immediate release tablet Take by mouth.    . OXYGEN 2 L.    . trazodone (DESYREL) 300 MG tablet Take 300 mg by mouth at bedtime.     No current facility-administered medications for this visit.     Allergies as of  08/29/2019 - never reviewed  Allergen Reaction Noted  . Albuterol Other (See Comments) 11/18/2009  . Codeine Hives and Other (See Comments) 11/18/2009  . Levothyroxine Other (See Comments) and Palpitations 05/16/2013  . Morphine Hives and Rash 06/05/2012  . Ciprofloxacin Hives, Other (See Comments), and Rash 11/18/2009  . Zolmitriptan Nausea And Vomiting 11/18/2009  . Tape Rash 01/02/2019    Family History  Problem Relation Age of Onset  . Cancer Mother   . Emphysema Father   . Heart attack Father   . Diabetes Sister   . Heart attack Brother     Social History   Socioeconomic History  . Marital status: Not on file    Spouse name: Not on file  . Number of children: Not on file  . Years of education: Not on file  . Highest education level: Not on file  Occupational History  . Not on file  Social Needs  . Financial resource strain: Not on file  .  Food insecurity    Worry: Not on file    Inability: Not on file  . Transportation needs    Medical: Not on file    Non-medical: Not on file  Tobacco Use  . Smoking status: Former Smoker    Types: Cigarettes    Quit date: 07/27/2012    Years since quitting: 7.0  Substance and Sexual Activity  . Alcohol use: Not on file  . Drug use: Not on file  . Sexual activity: Not on file  Lifestyle  . Physical activity    Days per week: Not on file    Minutes per session: Not on file  . Stress: Not on file  Relationships  . Social Herbalist on phone: Not on file    Gets together: Not on file    Attends religious service: Not on file    Active member of club or organization: Not on file    Attends meetings of clubs or organizations: Not on file    Relationship status: Not on file  . Intimate partner violence    Fear of current or ex partner: Not on file    Emotionally abused: Not on file    Physically abused: Not on file    Forced sexual activity: Not on file  Other Topics Concern  . Not on file  Social History  Narrative  . Not on file    Physical Exam: General:   Alert, pleasant and cooperative in NAD Head:  Normocephalic and atraumatic. Eyes:  Sclera clear, no icterus.   Conjunctiva pink. Ears:  Normal auditory acuity. Nose:  No deformity, discharge,  or lesions. Mouth:  No deformity or lesions. Cheliitis present.    Neck:  Supple; no masses or thyromegaly. Abdomen: Protuberent, firm, nontender,normal bowel sounds, no rebound or guarding. No hepatosplenomegaly.  No fluid wave.  Rectal:  Deferred  Msk:  Symmetrical. No boney deformities LAD: No inguinal or umbilical LAD Extremities:  No clubbing or edema. Neurologic:  Alert and  oriented x4;  grossly nonfocal Skin:  Intact without significant lesions or rashes. Psych:  Alert and cooperative. Normal mood and affect.   Nyle Limb L. Tarri Glenn, MD, MPH 08/29/2019, 12:36 PM

## 2019-10-09 ENCOUNTER — Ambulatory Visit (INDEPENDENT_AMBULATORY_CARE_PROVIDER_SITE_OTHER): Payer: 59 | Admitting: Gastroenterology

## 2019-10-09 ENCOUNTER — Other Ambulatory Visit: Payer: Self-pay

## 2019-10-09 VITALS — BP 114/68 | HR 83 | Temp 97.9°F | Ht 60.75 in | Wt 146.2 lb

## 2019-10-09 DIAGNOSIS — K5900 Constipation, unspecified: Secondary | ICD-10-CM

## 2019-10-09 DIAGNOSIS — R140 Abdominal distension (gaseous): Secondary | ICD-10-CM | POA: Diagnosis not present

## 2019-10-09 NOTE — Patient Instructions (Addendum)
Trial low fodmap diet x 4 weeks Discontinue Splenda and other sugar substitutes Trial of IBGuard Follow-up visit in 2 months

## 2020-06-04 ENCOUNTER — Institutional Professional Consult (permissible substitution): Payer: 59 | Admitting: Pulmonary Disease

## 2020-06-09 ENCOUNTER — Ambulatory Visit: Payer: 59 | Admitting: Orthopedic Surgery

## 2020-06-19 ENCOUNTER — Other Ambulatory Visit: Payer: Self-pay | Admitting: Orthopedic Surgery

## 2020-06-19 ENCOUNTER — Ambulatory Visit: Payer: 59

## 2020-06-19 ENCOUNTER — Other Ambulatory Visit: Payer: Self-pay

## 2020-06-19 ENCOUNTER — Ambulatory Visit (INDEPENDENT_AMBULATORY_CARE_PROVIDER_SITE_OTHER): Payer: 59 | Admitting: Orthopedic Surgery

## 2020-06-19 ENCOUNTER — Encounter: Payer: Self-pay | Admitting: Orthopedic Surgery

## 2020-06-19 VITALS — BP 114/63 | HR 71 | Ht 61.0 in | Wt 118.0 lb

## 2020-06-19 DIAGNOSIS — M25552 Pain in left hip: Secondary | ICD-10-CM

## 2020-06-19 DIAGNOSIS — G8929 Other chronic pain: Secondary | ICD-10-CM

## 2020-06-19 DIAGNOSIS — M792 Neuralgia and neuritis, unspecified: Secondary | ICD-10-CM

## 2020-06-19 DIAGNOSIS — M25511 Pain in right shoulder: Secondary | ICD-10-CM

## 2020-06-19 DIAGNOSIS — M47812 Spondylosis without myelopathy or radiculopathy, cervical region: Secondary | ICD-10-CM | POA: Diagnosis not present

## 2020-06-19 MED ORDER — MELOXICAM 7.5 MG PO TABS: 7.5000 mg | ORAL_TABLET | Freq: Every day | ORAL | 5 refills | Status: DC

## 2020-06-19 NOTE — Progress Notes (Signed)
NEW PROBLEM//OFFICE VISIT  Chief Complaint  Patient presents with  . Shoulder Pain    right   . Neck Pain    into left arm     58 yo female with over 2 yr h/o neck pain and recently right shoulder pain associated with right sh pain and pain with rom. Prior treatment includes injections in the shoulder   Currently on methadone and oxycodone for other problems      Review of Systems  Constitutional: Positive for diaphoresis and malaise/fatigue.  Respiratory: Positive for shortness of breath.   Gastrointestinal: Positive for constipation.  Musculoskeletal: Positive for back pain, falls, joint pain, myalgias and neck pain.  Skin: Positive for itching.  Neurological: Positive for weakness.  Endo/Heme/Allergies: Positive for polydipsia. Bruises/bleeds easily.  Psychiatric/Behavioral: Positive for memory loss. The patient is nervous/anxious.   All other systems reviewed and are negative.    Past Medical History:  Diagnosis Date  . Acute medial meniscus tear    left  . Allergic rhinitis   . Anxiety   . Arthritis   . Baker's cyst    left knee  . Carpal tunnel syndrome   . Compression fracture of thoracic vertebra (HCC)    #7 and C 6  . Constipation   . COPD (chronic obstructive pulmonary disease) ([place])   . Cystitis   . DDD (degenerative disc disease), thoracic   . Depression   . Diabetes mellitus ([place])   . Gallstones   . Hyperlipidemia   . Hypothyroidism   . Incontinence   . Insomnia   . Sleep apnea   . Surgical menopause   . Tobacco abuse   . Wegener's granulomatosis ([place])    on remission 01/01/2019    Past Surgical History:  Procedure Laterality Date  . ABDOMINAL HYSTERECTOMY    . BIOPSY  09/16/2019   Procedure: BIOPSY;  Surgeon: Thornton Park, MD;  Location: [facility];  Service: Gastroenterology;;  . BLADDER REPAIR    . CHOLECYSTECTOMY    . COLONOSCOPY    . ESOPHAGOGASTRODUODENOSCOPY    . ESOPHAGOGASTRODUODENOSCOPY (EGD) WITH PROPOFOL N/A 09/16/2019    Procedure: ESOPHAGOGASTRODUODENOSCOPY (EGD) WITH PROPOFOL;  Surgeon: Thornton Park, MD;  Location: [facility];  Service: Gastroenterology;  Laterality: N/A;  . KNEE SURGERY Left   . LUNG BIOPSY    . TONSILLECTOMY    . WRIST ARTHROSCOPY Right    release of carpal ligament     Family History  Problem Relation Age of Onset  . Cancer Mother        type unknown  . Emphysema Father   . Heart attack Father   . Diabetes Sister   . Heart attack Brother    Social History   Tobacco Use  . Smoking status: Former Smoker    Types: Cigarettes    Quit date: 07/27/2012    Years since quitting: 7.9  . Smokeless tobacco: Never Used  Substance Use Topics  . Alcohol use: Not Currently  . Drug use: Not on file    Allergies  Allergen Reactions  . Albuterol Other (See Comments)    "Messed up my mouth!"   . Codeine Hives and Other (See Comments)    Other: sores in mouth Unsure of reaction.   . Levothyroxine Other (See Comments) and Palpitations    depression depression   . Morphine Hives and Rash    Dermal patch Dermal patch   . Ciprofloxacin Hives, Other (See Comments) and Rash    Oral ulcer   . Zomig [  Zolmitriptan] Nausea And Vomiting  . Tape Rash    Can tolerate for short time Paper tape okay    Current Meds  Medication Sig  . ALPRAZolam (XANAX) 1 MG tablet Take 1 mg by mouth 3 (three) times daily as needed for anxiety.  . benzonatate (TESSALON) 100 MG capsule Take 200 mg by mouth as needed.   . bisacodyl (GENTLE LAXATIVE) 5 MG EC tablet Take 2 tablets by mouth daily as needed for moderate constipation.   . chlorhexidine (PERIDEX) 0.12 % solution Take 15 mLs by mouth 2 (two) times daily after a meal.  . citalopram (CELEXA) 20 MG tablet Take 20 mg by mouth daily.  Marland Kitchen escitalopram (LEXAPRO) 20 MG tablet Take 20 mg by mouth daily.  Marland Kitchen HYDROcodone-acetaminophen (NORCO) 10-325 MG tablet Take 1 tablet by mouth daily.   Marland Kitchen levothyroxine (SYNTHROID) 88 MCG tablet Take 88 mcg  by mouth daily before breakfast.   . lubiprostone (AMITIZA) 24 MCG capsule Take 24 mcg by mouth daily as needed for constipation.   . methadone (DOLOPHINE) 10 MG tablet Take 10 mg by mouth every 12 (twelve) hours.   . METHADONE HCL PO Take by mouth.  . naloxone (NARCAN) 4 MG/0.1ML LIQD nasal spray kit Place 4 mg into the nose once.   . Oxycodone HCl 10 MG TABS SMARTSIG:1 Tablet(s) By Mouth  . OXYGEN Place 2 L into the nose at bedtime.   . trazodone (DESYREL) 300 MG tablet Take 300 mg by mouth at bedtime.    BP 114/63   Pulse 71   Ht 5' 1"  (1.549 m)   Wt 118 lb (53.5 kg)   BMI 22.30 kg/m   Physical Exam Constitutional:      General: She is not in acute distress.    Appearance: She is well-developed.  Cardiovascular:     Comments: No peripheral edema Skin:    General: Skin is warm and dry.  Neurological:     Mental Status: She is alert and oriented to person, place, and time.     Sensory: No sensory deficit.     Coordination: Coordination normal.     Gait: Gait normal.     Deep Tendon Reflexes: Reflexes are normal and symmetric.     Back Exam   Tenderness  The patient is experiencing tenderness in the cervical.  Range of Motion  Extension: abnormal  Flexion: abnormal   Reflexes  Biceps: normal  Comments:  C5, 6, 7, 8 T1 normal strength bilateral      Right Shoulder Exam   Tenderness  The patient is experiencing no tenderness.  Range of Motion  The patient has normal right shoulder ROM.  Muscle Strength  The patient has normal right shoulder strength.  Tests  Apprehension: negative Impingement: positive  Other  Erythema: absent Scars: absent Sensation: normal Pulse: present   Left Shoulder Exam   Tenderness  The patient is experiencing no tenderness.   Range of Motion  The patient has normal left shoulder ROM.  Muscle Strength  The patient has normal left shoulder strength.  Tests  Apprehension: negative Impingement: negative  Other   Erythema: absent Scars: absent Sensation: normal Pulse: present         MEDICAL DECISION MAKING  A.  Encounter Diagnoses  Name Primary?  . Cervical spondylosis without myelopathy Yes  . Chronic right shoulder pain     B. DATA ANALYSED: IMAGING: Independent interpretation of images: shoulder mild OA   c spine possible C6 old compression fx and  mild cervical spondylosis   Orders: PT   Outside records reviewed: no  C. MANAGEMENT     Meds ordered this encounter  Medications  . meloxicam (MOBIC) 7.5 MG tablet    Sig: Take 1 tablet (7.5 mg total) by mouth daily.    Dispense:  30 tablet    Refill:  5      Arther Abbott, MD  06/19/2020 10:28 AM

## 2020-07-09 IMAGING — CT CT ABD-PELV W/ CM
2 of 5 series · 16 of 46 positions shown, 18 images · IV contrast (APPLIED)
Comparison: None.

CLINICAL DATA: Abdominal pain and distention. Epigastric pain for 9
months.

EXAM:
CT ABDOMEN AND PELVIS WITH CONTRAST
TECHNIQUE: Multidetector CT imaging of the abdomen and pelvis was performed
using the standard protocol following bolus administration of
intravenous contrast.
CONTRAST:  100mL OMNIPAQUE IOHEXOL 300 MG/ML  SOLN

[Series 2: axial st · axial · 0.76mm/px · z∈[-406,-46]mm · 13 of 86 slices shown, 15 images]
[im 7/86  soft-tissue]
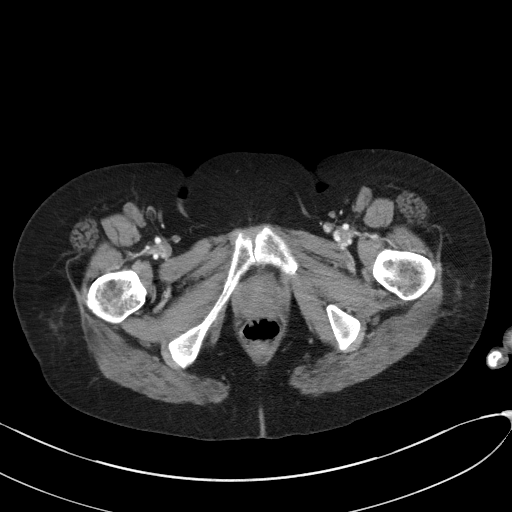
[im 7/86  bone]
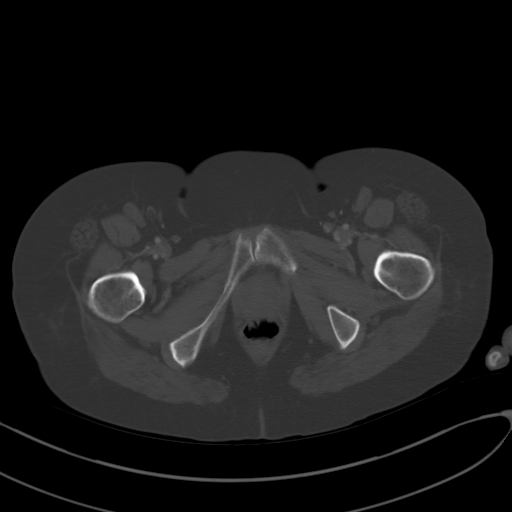
[im 13/86  soft-tissue]
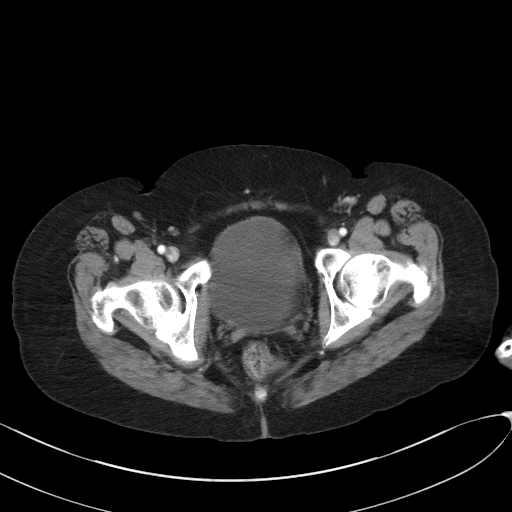
[im 19/86  soft-tissue]
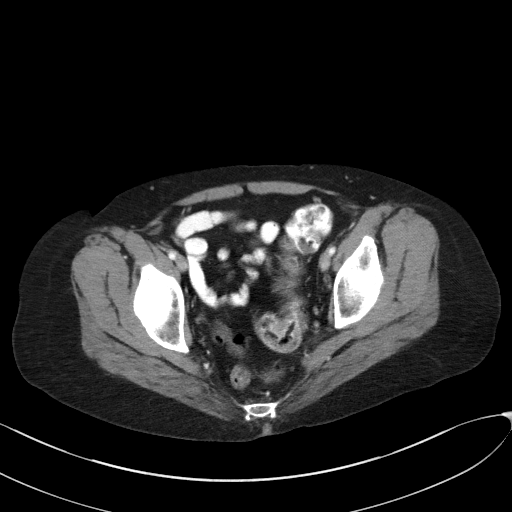
[im 25/86  soft-tissue]
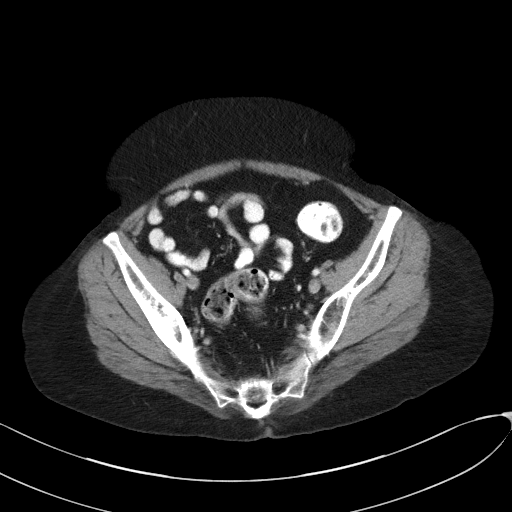
[im 31/86  soft-tissue]
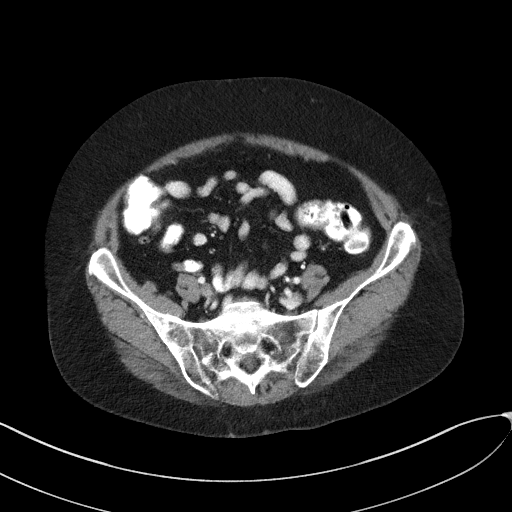
[im 37/86  soft-tissue]
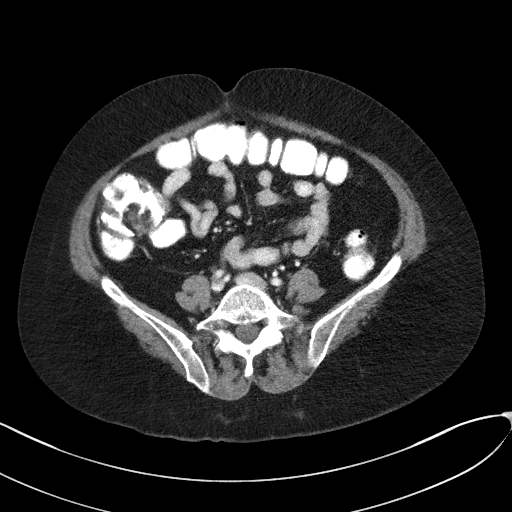
[im 43/86  soft-tissue]
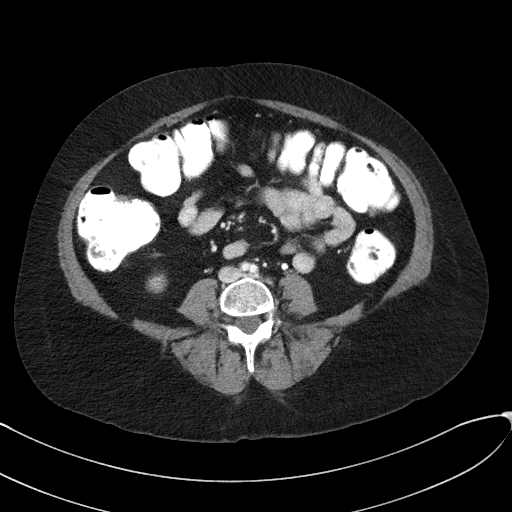
[im 49/86  soft-tissue]
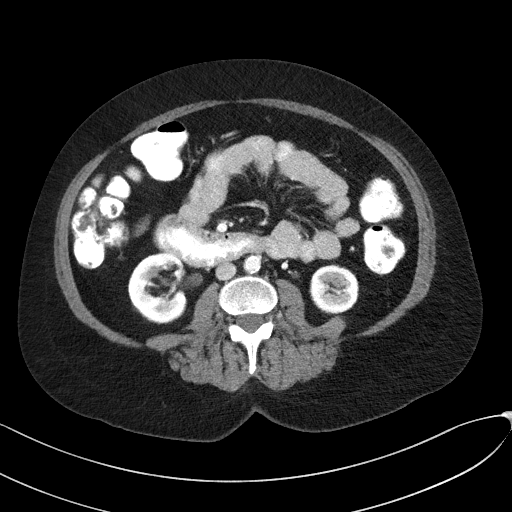
[im 55/86  soft-tissue]
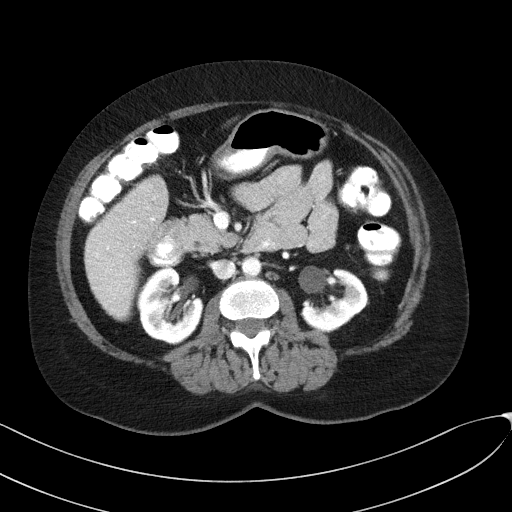
[im 55/86  bone]
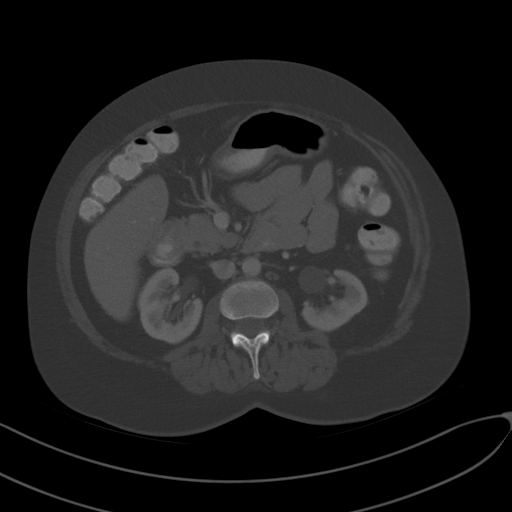
[im 61/86  soft-tissue]
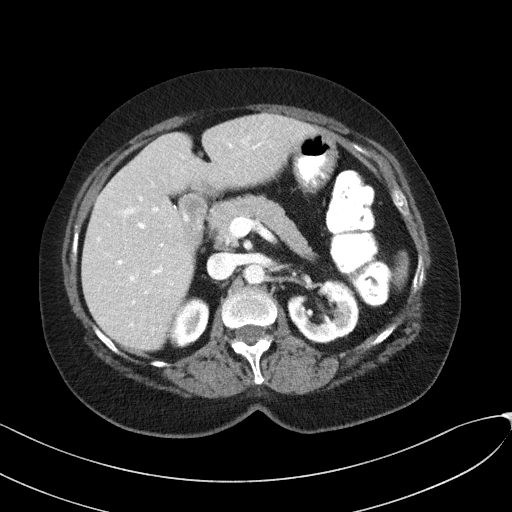
[im 67/86  soft-tissue]
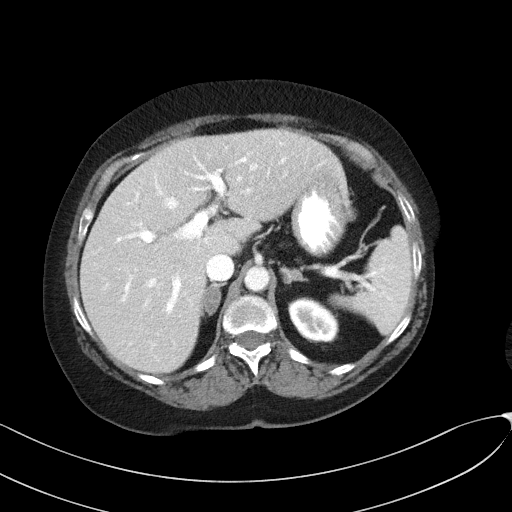
[im 73/86  soft-tissue]
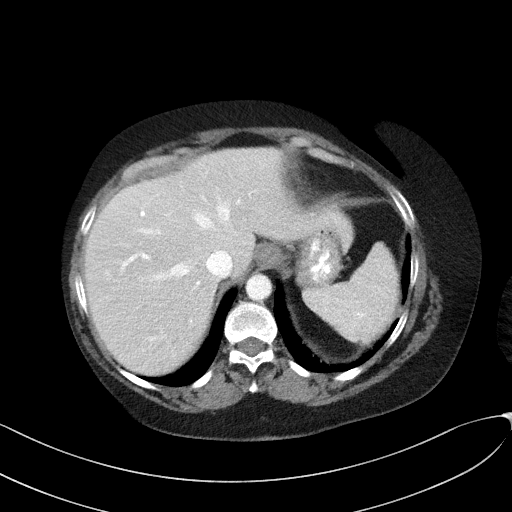
[im 79/86  soft-tissue]
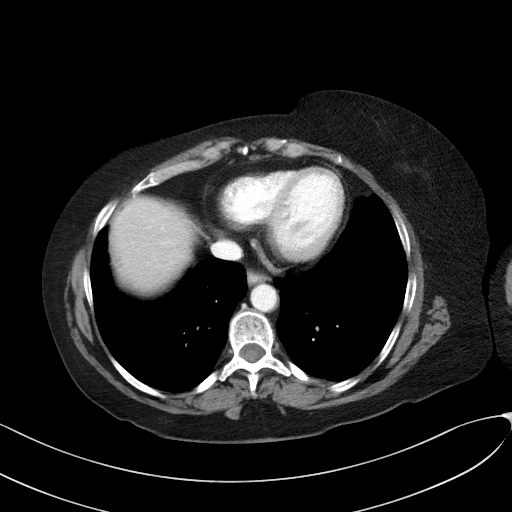

[Series 5: coronal st · coronal · 0.66mm/px · 3 of 94 slices shown]
[im 32/94  soft-tissue]
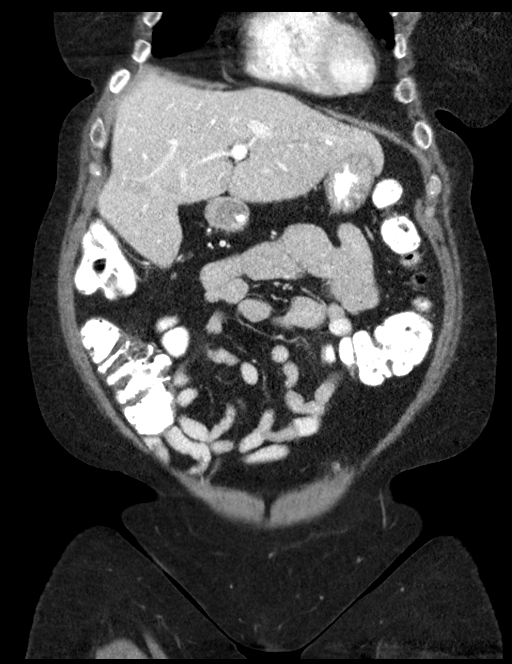
[im 42/94  soft-tissue]
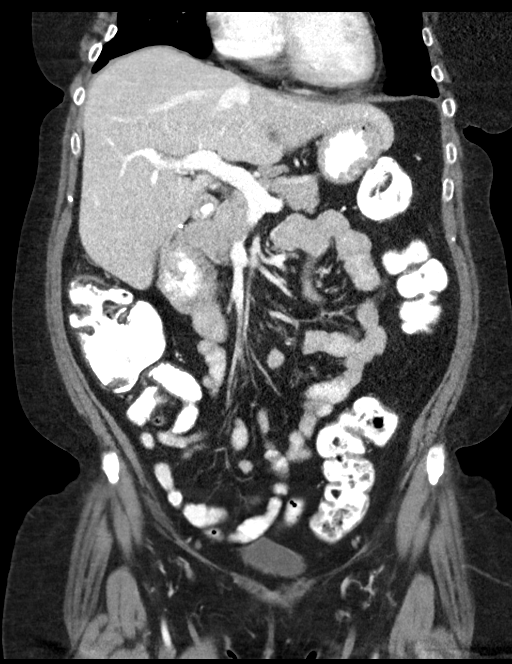
[im 52/94  soft-tissue]
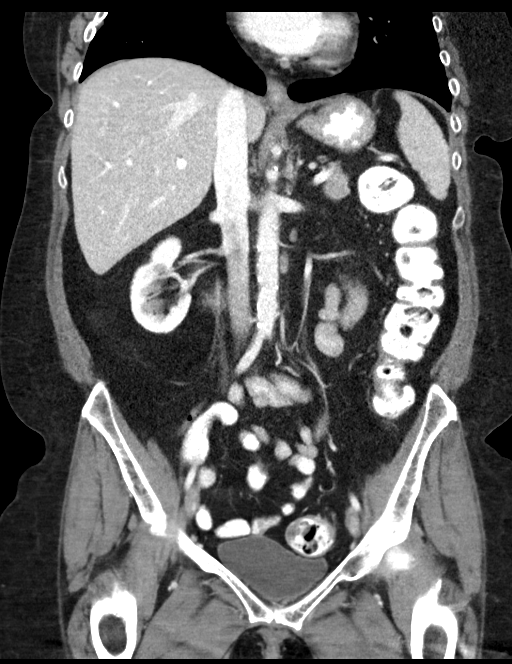

[16 of 46 positions shown; findings below may reference images not displayed]

FINDINGS: Lower chest: No acute abnormality.

Hepatobiliary: Hepatic steatosis is identified. The portal vein is
patent. The patient is status post cholecystectomy.

Pancreas: Unremarkable. No pancreatic ductal dilatation or
surrounding inflammatory changes.

Spleen: Normal in size without focal abnormality.

Adrenals/Urinary Tract: There is a nodule measuring 2 cm in the
right adrenal gland with an attenuation of 73 Hounsfield units. The
attenuation is somewhat heterogeneous with regions of low
attenuation. 1 or 2 smaller nodules are seen in the left adrenal
gland measuring 9 and 10 mm. Several probable tiny renal cysts are
identified, too small to characterize. No suspicious renal masses or
stones. No hydronephrosis or perinephric stranding. Extrarenal
pelvises are identified bilaterally with no ureterectasis or
ureteral stones identified. The bladder is unremarkable.

Stomach/Bowel: The stomach is normal in appearance. The small bowel
is normal. The colon is normal. The appendix is unremarkable.

Vascular/Lymphatic: Atherosclerotic changes are seen in the
nonaneurysmal aorta. No adenopathy identified.

Reproductive: Status post hysterectomy. No adnexal masses.

Other: No abdominal wall hernia or abnormality. No abdominopelvic
ascites.

Musculoskeletal: No acute or significant osseous findings.
IMPRESSION: 1. Hepatic steatosis.  Previous cholecystectomy.
2. Bilateral adrenal nodules. The largest nodule measures 2 cm on
the right. These nodules are indeterminate but are probably benign.
Consider a 12 month follow-up adrenal CT scan for further
evaluation.
3. Several probable tiny cysts are seen in the kidneys, too small to
characterize definitively.
4. Atherosclerotic changes are seen in the nonaneurysmal aorta.

## 2020-12-06 ENCOUNTER — Other Ambulatory Visit: Payer: Self-pay | Admitting: Orthopedic Surgery

## 2020-12-06 DIAGNOSIS — M47812 Spondylosis without myelopathy or radiculopathy, cervical region: Secondary | ICD-10-CM

## 2020-12-06 DIAGNOSIS — G8929 Other chronic pain: Secondary | ICD-10-CM

## 2023-02-09 ENCOUNTER — Encounter: Payer: Self-pay | Admitting: Radiology
